# Patient Record
Sex: Male | Born: 1965 | Hispanic: Yes | Marital: Married | Smoking: Never smoker
Health system: Southern US, Community
[De-identification: ages and names within clinical notes are randomized; demographics above are authoritative.]

## PROBLEM LIST (undated history)

## (undated) DIAGNOSIS — I10 Essential (primary) hypertension: Secondary | ICD-10-CM

---

## 2013-07-18 ENCOUNTER — Emergency Department (HOSPITAL_COMMUNITY): Payer: No Typology Code available for payment source

## 2013-07-18 ENCOUNTER — Emergency Department (HOSPITAL_COMMUNITY)
Admission: EM | Admit: 2013-07-18 | Discharge: 2013-07-18 | Disposition: A | Discharge status: Home / Self Care | Payer: No Typology Code available for payment source | Attending: Emergency Medicine | Admitting: Emergency Medicine

## 2013-07-18 ENCOUNTER — Encounter (HOSPITAL_COMMUNITY): Payer: Self-pay | Admitting: Emergency Medicine

## 2013-07-18 DIAGNOSIS — S199XXA Unspecified injury of neck, initial encounter: Secondary | ICD-10-CM

## 2013-07-18 DIAGNOSIS — M5126 Other intervertebral disc displacement, lumbar region: Secondary | ICD-10-CM | POA: Insufficient documentation

## 2013-07-18 DIAGNOSIS — S59909A Unspecified injury of unspecified elbow, initial encounter: Secondary | ICD-10-CM | POA: Insufficient documentation

## 2013-07-18 DIAGNOSIS — IMO0002 Reserved for concepts with insufficient information to code with codable children: Secondary | ICD-10-CM | POA: Insufficient documentation

## 2013-07-18 DIAGNOSIS — M5136 Other intervertebral disc degeneration, lumbar region: Secondary | ICD-10-CM

## 2013-07-18 DIAGNOSIS — Y9389 Activity, other specified: Secondary | ICD-10-CM | POA: Insufficient documentation

## 2013-07-18 DIAGNOSIS — S060X9A Concussion with loss of consciousness of unspecified duration, initial encounter: Secondary | ICD-10-CM | POA: Insufficient documentation

## 2013-07-18 DIAGNOSIS — Y92410 Unspecified street and highway as the place of occurrence of the external cause: Secondary | ICD-10-CM | POA: Insufficient documentation

## 2013-07-18 DIAGNOSIS — S3981XA Other specified injuries of abdomen, initial encounter: Secondary | ICD-10-CM | POA: Insufficient documentation

## 2013-07-18 DIAGNOSIS — M549 Dorsalgia, unspecified: Secondary | ICD-10-CM

## 2013-07-18 DIAGNOSIS — M542 Cervicalgia: Secondary | ICD-10-CM

## 2013-07-18 DIAGNOSIS — S0993XA Unspecified injury of face, initial encounter: Secondary | ICD-10-CM | POA: Insufficient documentation

## 2013-07-18 DIAGNOSIS — S6990XA Unspecified injury of unspecified wrist, hand and finger(s), initial encounter: Secondary | ICD-10-CM | POA: Insufficient documentation

## 2013-07-18 DIAGNOSIS — S59919A Unspecified injury of unspecified forearm, initial encounter: Secondary | ICD-10-CM

## 2013-07-18 DIAGNOSIS — R109 Unspecified abdominal pain: Secondary | ICD-10-CM | POA: Insufficient documentation

## 2013-07-18 LAB — BASIC METABOLIC PANEL
BUN: 11 mg/dL (ref 6–23)
CO2: 23 mEq/L (ref 19–32)
Calcium: 8.8 mg/dL (ref 8.4–10.5)
Chloride: 105 mEq/L (ref 96–112)
Creatinine, Ser: 0.64 mg/dL (ref 0.50–1.35)
GFR calc Af Amer: >90 mL/min (ref >90)
GFR calc non Af Amer: >90 mL/min (ref >90)
Glucose, Bld: 111 mg/dL — ABNORMAL HIGH (ref 70–99)
Potassium: 4 mEq/L (ref 3.7–5.3)
Sodium: 143 mEq/L (ref 137–147)

## 2013-07-18 MED ORDER — IOHEXOL 300 MG/ML  SOLN
100.0000 mL | Freq: Once | INTRAMUSCULAR | Status: AC | PRN
  Administered 2013-07-18: 100 mL via INTRAVENOUS

## 2013-07-18 MED ORDER — HYDROCODONE-ACETAMINOPHEN 5-325 MG PO TABS: 1.0000 | ORAL_TABLET | ORAL | Status: DC | PRN

## 2013-07-18 MED ORDER — MORPHINE SULFATE 4 MG/ML IJ SOLN
4.0000 mg | Freq: Once | INTRAMUSCULAR | Status: AC
  Administered 2013-07-18: 4 mg via INTRAVENOUS
  Filled 2013-07-18: qty 1

## 2013-07-18 MED ORDER — IBUPROFEN 800 MG PO TABS: 800.0000 mg | ORAL_TABLET | Freq: Three times a day (TID) | ORAL | Status: DC | PRN

## 2013-07-18 MED ORDER — HYDROCODONE-ACETAMINOPHEN 5-325 MG PO TABS: 2.0000 | ORAL_TABLET | Freq: Once | ORAL | Status: DC

## 2013-07-18 NOTE — Discharge Instructions (Signed)
Read the information below.  Use the prescribed medication as directed.  Please discuss all new medications with your pharmacist.  Do not take additional tylenol while taking the prescribed pain medication to avoid overdose.  You may return to the Emergency Department at any time for worsening condition or any new symptoms that concern you.   If you develop fevers, loss of control of bowel or bladder, weakness or numbness in your legs, or are unable to walk, return to the ER for a recheck. If you develop high fevers, worsening abdominal pain, uncontrolled vomiting, or are unable to tolerate fluids by mouth, return to the ER for a recheck.    Lea la informacin a continuacin. Usar el medicamento recetado como se indica. Por favor discutir todos los nuevos medicamentos con su farmacutico. No tome Tylenol adicional mientras est tomando el medicamento para el dolor recetada para evitar sobredosis. Usted puede volver a la sala de Oceanographer en cualquier momento por el empeoramiento de la condicin o nuevos sntomas que le preocupan . Si usted desarrolla fiebre, prdida de control del intestino o la vejiga , debilidad o entumecimiento en las piernas , o no puede caminar , volver a la sala de Database administrator a Ecologist . Si usted desarrolla fiebre alta, aumento del dolor abdominal, vmitos incontrolados , o es incapaz de Web designer lquidos por va oral , volver a la sala de emergencias de volver a examinar .    Colisin con un vehculo de motor Academic librarian) Luego de una colisin, es comn presentar mltiples moretones y Research scientist (life sciences). Estas molestias generalmente empeoran durante las primeras 24 horas. Usted gradualmente se pondr ms rgido y con ms dolor en las horas siguientes. Podr sentirse peor cuando despierte en la maana siguiente al accidente. A partir de all, debera comenzar a Risk manager que pase. La velocidad con que se mejora generalmente depende de la gravedad de la  colisin, la cantidad de lesiones y la ubicacin y Firefighter de las mismas. INSTRUCCIONES PARA EL CUIDADO EN EL HOGAR   Aplique hielo sobre la zona lesionada.  Ponga el hielo en una bolsa plstica.  Colquese una toalla entre la piel y la bolsa de hielo.  Deje el hielo durante 15 a 20 minutos, 3 a 4 veces por da.  Debe ingerir gran cantidad de lquido para mantener la orina de tono claro o color amarillo plido.  No beba alcohol.  Tome una ducha o un bao caliente o bese una o dos veces por da. Esto aumentar el flujo de Computer Sciences Corporation msculos doloridos.  Puede volver a sus ocupaciones cuando se lo indique el mdico. Tenga cuidado al levantar objetos, ya que puede agravar el dolor en el cuello o en la espalda.  Utilice los medicamentos de venta libre o de prescripcin para Chief Technology Officer, Environmental health practitioner o la [place], segn se lo indique el profesional que lo asiste. No tome aspirina. Podran aumentar los hematomas o las hemorragias. SOLICITE ATENCIN MDICA DE INMEDIATO SI SIENTE:  Entumecimiento, hormigueo, debilidad o problemas con el uso de los brazos o las piernas.  Dolor de cabeza intenso que no mejora con medicamentos.  Siente dolor intenso en el cuello, especialmente sensibilidad en el centro de la espalda o el cuello.  Cambios en el control del intestino o la vejiga.  Aumento del dolor en cualquier parte del cuerpo.  Falta de aire, mareos o [place].  Siente dolor en el pecho.  Nuseas, vmitos o sudoracin.  Aumento del malestar abdominal.  Sangre en la orina, en las heces o vmitos con Terltonsangre.  Siente dolor en los hombros (en la zona de los breteles).  Que sus sntomas empeoran. EST SEGURO QUE:   Comprende las instrucciones para el alta mdica.  Controlar su enfermedad.  Solicitar atencin mdica de inmediato segn las indicaciones. Document Released: 01/01/2005 Document Revised: 06/16/2011 [facility] Patient Information 2014 [place]ExitCare, [place]LLC.  Dolor  msculoesqueltico (Musculoskeletal Pain) El dolor musculoesqueltico se siente en huesos y msculos. El dolor puede ocurrir en cualquier parte del cuerpo. El profesional que lo asiste podr tratarlo sin Geologist, engineeringconocer la causa del dolor. Lo tratar Time Warneraunque las pruebas de laboratorio (sangre y Comorosorina), las radiografas y otros estudios sean normales. La causa de estos dolores puede ser un virus.  CAUSAS Generalmente no existe una causa definida para este trastorno. Tambin el Citigroupmalestar puede deberse a la Silverstreetactividad excesiva. En la actividad excesiva se incluye el hacer ejercicios fsicos muy intensos cuando no se est en buena forma. El dolor de huesos tambin puede deberse a cambios climticos. Los huesos son sensibles a los cambios en la presin atmosfrica. INSTRUCCIONES PARA EL CUIDADO DOMICILIARIO  Para proteger su privacidad, no se entregarn los The Sherwin-Williamsresultados de las pruebas por telfono. Asegrese de conseguirlos. Consulte el modo en que podr obtenerlos si no se lo han informado. Es su responsabilidad contar con los Lubrizol Corporationresultados de las pruebas.  Utilice los medicamentos de venta libre o de prescripcin para Chief Technology Officerel dolor, Environmental health practitionerel malestar o la Cheyennefiebre, segn se lo indique el profesional que lo asiste. Si le han administrado medicamentos, no conduzca, no opere maquinarias ni Diplomatic Services operational officerherramientas elctricas, y tampoco firme documentos legales durante 24 horas. No beba alcohol. No tome pldoras para dormir ni otros medicamentos que Museum/gallery curatorpuedan interferir en el tratamiento.  Podr seguir con todas las actividades a menos que stas le ocasionen ms Merck & Codolor. Cuando el dolor disminuya, es importante que gradualmente reanude toda la rutina habitual. Retome las actividades comenzando lentamente. Aumente gradualmente la intensidad y la duracin de sus actividades o del ejercicio.  Durante los perodos de dolor intenso, el reposo en cama puede ser beneficioso. Recustese o sintese en la posicin que le sea ms cmoda.  Coloque hielo sobre la  zona afectada.  Ponga hielo en Lucile Shuttersuna bolsa.  Colquese una toalla entre la piel y la bolsa de hielo.  Aplique el hielo durante 10 a 20 minutos 3  4 veces por da.  Si el dolor empeora, o no desaparece puede ser Northeast Utilitiesnecesario repetir las pruebas o Education officer, environmentalrealizar nuevos exmenes. El profesional que lo asiste podr requerir investigar ms profundamente para Veterinary surgeonencontrar la causa posible. SOLICITE ATENCIN MDICA DE INMEDIATO SI:  Siente que el dolor empeora y no se alivia con los medicamentos.  Siente dolor en el pecho asociado a falta de aire, sudoracin, nuseas o vmitos.  El dolor se localiza en el abdomen.  Comienza a sentir nuevos sntomas que parecen ser diferentes o que lo preocupan. ASEGRESE DE QUE:   Comprende las instrucciones para el alta mdica.  Controlar su enfermedad.  Solicitar atencin mdica de inmediato segn las indicaciones. Document Released: 01/01/2005 Document Revised: 06/16/2011 [facility] Patient Information 2014 CoramExitCare, MarylandLLC.   Emergency Department Resource Guide 1) Find a Doctor and Pay Out of Pocket Although you won't have to find out who is covered by your insurance plan, it is a good idea to ask around and get recommendations. You will then need to call the office and see if the doctor you have chosen will accept you as a new patient  and what types of options they offer for patients who are self-pay. Some doctors offer discounts or will set up payment plans for their patients who do not have insurance, but you will need to ask so you aren't surprised when you get to your appointment.  2) Contact Your Local Health Department Not all health departments have doctors that can see patients for sick visits, but many do, so it is worth a call to see if yours does. If you don't know where your local health department is, you can check in your phone book. The CDC also has a tool to help you locate your state's health department, and many state websites also have listings  of all of their local health departments.  3) Find a Walk-in Clinic If your illness is not likely to be very severe or complicated, you may want to try a walk in clinic. These are popping up all over the country in pharmacies, drugstores, and shopping centers. They're usually staffed by nurse practitioners or physician assistants that have been trained to treat common illnesses and complaints. They're usually fairly quick and inexpensive. However, if you have serious medical issues or chronic medical problems, these are probably not your best option.  No Primary Care Doctor: - Call Health Connect at  [phone] - they can help you locate a primary care doctor that  accepts your insurance, provides certain services, etc. - Physician Referral Service- [phone]  Chronic Pain Problems: Organization         Address  Phone   Notes  [facility]  [phone] Patients need to be referred by their primary care doctor.   Medication Assistance: Organization         Address  Phone   Notes  [facility] [address] [place], [place] [address] [phone]) [phone] --Must be a resident of [facility] -- Must have NO insurance coverage whatsoever (no Medicaid/ Medicare, etc.) -- The pt. MUST have a primary care doctor that directs their care regularly and follows them in the community   MedAssist  [phone]   [employer]  [phone]    Agencies that provide inexpensive medical care: Organization         Address  Phone   Notes  [facility]  [phone]   [facility]    [phone]   [facility] [address] [place], [place] [address] [phone]   [facility] [place] [address], [place] [phone]   [facility]anned Parenthood    [phone]   [facility]    [phone]   [facility]  [address], [place] Phone:  [phone](336) [phone], Fax:  [phone]) [phone] Hours of Operation:  9 am - 6 pm, M-F.  Also accepts Medicaid/Medicare and self-pay.  [facility]  [address], [place] Phone: [phone], Fax: [phone]. Hours of Operation:  8:30 am - 5:30 pm, M-F.  Also accepts Medicaid and self-pay.  [facility] [place] [address], [place] Phone: [phone]) [phone]   [facility] [address], [place], [place] [phone], Ext. [phone] Mondays & Thursdays: 7-9 AM.  First 15 patients are seen on a first come, first serve basis.    Medicaid-accepting [facility] Providers:  Retail buyerrganization         Address  Phone  Notes  [facility] [address], [place] [phone] Also accepts self-pay patients.  [facility] [address] Laurell Josephs [place], [place]  [phone]   [facility] [address], [place] [phone]   [facility] [address], [place] [phone]   Renaye Rakers [address], [place]   [phone] Only accepts [place] Access [place] patients after they have their name applied to their card.   Self-Pay (no insurance) in [facility]:  Organization         Address  Phone   Notes  Sickle Cell Patients, [facility] [address] [place], [place] [phone]   [facility] [address] [place], [place] [phone]   [facility] [place]  [address], [place] [phone]   [facility]/Dr. Osei-Bonsu  [address] [place] or [address], [place] [phone] Phone number for both [place] and [place] locations is the same.  [facility] [address], [place] [phone]   [facility] [address] [place] or [address] [phone]   [facility] [address], [place] [phone], phone; [phone], fax Sees patients 1st and 3rd Saturday of every month.  Must not qualify for public or private insurance (i.e. Medicaid, Medicare, [place] Health Choice, Veterans' Benefits)  Household income should be no more than 200% of the poverty level The clinic cannot treat you if you are pregnant or think you are pregnant  Sexually transmitted diseases are not treated at the clinic.    Dental Care: Organization         Address  Phone  Notes  [facility] [address] [place], [place] [phone] Accepts children up to age 76 who are enrolled in [place] or [place] Health Choice; pregnant women with a Medicaid card; and children who have applied for Medicaid or [place] Health Choice, but were declined, whose parents can pay a reduced fee at time of service.  [facility]  [address], [place] [phone] Accepts children up to age 75 who are enrolled in [place] or Ages Health Choice; pregnant women with a Medicaid card; and children who have applied for Medicaid or  Health Choice, but were declined, whose parents can pay a reduced fee at time of service.  [facility]  [address] [place], [place] [phone] Patients are seen by appointment only. Walk-ins are not accepted. [facility] will see patients 80 years of age and older. Monday - Tuesday (8am-5pm) Most Wednesdays (8:30-5pm) $30 per visit, cash only  [facility]  [address], [facility] [phone] Patients are seen by appointment only. Walk-ins are not accepted. [facility] will see patients 80 years of age and older. One Wednesday Evening (Monthly: Volunteer Based).  $30 per visit, cash only  [employer]   [phone] for adults; Children under age 26, call Graduate Pediatric Dentistry at [phone]. Children aged 2-14, please call [phone] to request a pediatric application.  Dental services are provided in all areas of dental care including fillings, crowns and bridges, complete  and partial dentures, implants, gum treatment, root canals, and extractions. Preventive care is also provided. Treatment is provided to both adults and children. Patients are selected via a lottery and there is often a waiting list.   [facility] [address], [place]  [phone] [email or web address]   [facility] [address] [place], [place] [phone], Ext. [phone] Second and Fourth Thursday of each month, opens at 6:30 AM; Clinic ends at 9 AM.  Patients are seen on a first-come first-served basis, and a limited number are seen during each clinic.   [facility]  [address] Ether Griffins Carvel Huskins [place], [place] [phone]   Eligibility Requirements You must have lived in [place], [place], or [place] counties for at least the last three months.   You cannot be eligible for state or federal sponsored [place], including [employer], [place], or [employer].   You generally cannot be eligible for healthcare insurance through your employer.    How to apply: Eligibility screenings are held every Tuesday and Wednesday afternoon from 1:00 pm until 4:00 pm. You do not need an appointment for the interview!  [facility] [address] [place], [place] [phone]   [facility] Health Department  [phone]   [facility] Health Department  [phone]   [facility] Health Department  [phone]    Behavioral Health Resources in the Community: Intensive Outpatient Programs Organization         Address  Phone  Notes  [facility] Services [address] [place], [place] [phone]   [facility] Outpatient [address] [place], [place] [phone]   [facility] [address] [place], [place]  [phone]   [facility] Mental Health [address]  [place], [place] [phone] or [phone]   Substance Abuse Resources Organization         Address  Phone  Notes  Alcohol and Drug Services  [phone]   [facility]  [phone]   [facility] [place]  [phone]   Floydene Flock  [phone]   Residential & Outpatient Substance Abuse Program  [phone]   Psychological Services Organization         Address  Phone  Notes  [facility] Behavioral Health  [phone]   [facility] Services  [phone]   [facility] Mental Health [address] [place] [phone] or [phone]    Mobile Crisis Teams Organization         Address  Phone  Notes  [facility], Mobile Crisis Care Unit  [phone]   [facility]  [address] [place], [place] [phone]   Doristine Locks [address] [place] [phone]    Self-Help/Support Groups Organization         Address  Phone             Notes  [facility] [place] - variety of support groups  [phone]- I7437963 Call for more information  Narcotics Anonymous (NA), [facility] [address], [employer] [place]  2 meetings at this location   Statistician         Address  Phone  Notes  [facility] [address],    [place]  [phone]   [facility]  [address], [place] [address], [place], [place] [phone]   [facility] [address] [place], [place] [phone] Admissions: 8am-3pm M-F  [facility] [address],    [place], [place] [phone]   [facility] [address] [place], [place], [place] [phone]   [facility] [address],  [place], [place] [phone]     [facility] - Intensive Outpatient [address]urell [address], [place], [place] [phone]   [facility] [address] [place]  [place], [place] [phone] or [phone]   [facility] ([facility]) [address], [place], [place] [phone] Accepts Medicaid  [facility] [address],  [place] [phone] Substance Abuse/Addiction Treatment   [facility] Organization         Address  Phone  Notes  [facility]  [phone]   Angie Fava, PhD [address] Ervin Knack [place], [place]   [phone] or [phone]   [facility] Behavioral   [address] [place], [place] [phone]   [facility] [address], [place], [place] [phone] Insurance/Medicaid/sponsorship through [facility] [address]                                    [place], [place] [phone] Therapy/tele-psych/case  [facility] [address], [place] [phone]    Dr. Lolly Mustache  [phone]   [facility] [place]  United Way [facility] Dept. 1) [address] [place] 2) [address], [place] 3)  [address], [place] [phone]  [phone]   [facility] Child Abuse Hotline (325) 002-7249 or [phone] (After Hours)
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
## 2013-07-18 NOTE — ED Notes (Signed)
Upgraded to acuity 3 per PA.

## 2013-07-21 NOTE — ED Provider Notes (Signed)
Medical screening examination/treatment/procedure(s) were performed by non-physician practitioner and as supervising physician I was immediately available for consultation/collaboration.  Melora Menon, MD 07/21/13 0716 

## 2017-12-01 ENCOUNTER — Encounter (HOSPITAL_COMMUNITY): Payer: Self-pay | Admitting: Emergency Medicine

## 2017-12-01 ENCOUNTER — Emergency Department (HOSPITAL_COMMUNITY): Payer: No Typology Code available for payment source

## 2017-12-01 ENCOUNTER — Emergency Department (HOSPITAL_COMMUNITY)
Admission: EM | Admit: 2017-12-01 | Discharge: 2017-12-01 | Disposition: A | Discharge status: Home / Self Care | Payer: No Typology Code available for payment source | Attending: Emergency Medicine | Admitting: Emergency Medicine

## 2017-12-01 ENCOUNTER — Other Ambulatory Visit: Payer: Self-pay

## 2017-12-01 DIAGNOSIS — Y998 Other external cause status: Secondary | ICD-10-CM | POA: Diagnosis not present

## 2017-12-01 DIAGNOSIS — T148XXA Other injury of unspecified body region, initial encounter: Secondary | ICD-10-CM

## 2017-12-01 DIAGNOSIS — S161XXA Strain of muscle, fascia and tendon at neck level, initial encounter: Secondary | ICD-10-CM | POA: Insufficient documentation

## 2017-12-01 DIAGNOSIS — S46912A Strain of unspecified muscle, fascia and tendon at shoulder and upper arm level, left arm, initial encounter: Secondary | ICD-10-CM | POA: Diagnosis not present

## 2017-12-01 DIAGNOSIS — S29012A Strain of muscle and tendon of back wall of thorax, initial encounter: Secondary | ICD-10-CM | POA: Diagnosis not present

## 2017-12-01 DIAGNOSIS — Y9389 Activity, other specified: Secondary | ICD-10-CM | POA: Insufficient documentation

## 2017-12-01 DIAGNOSIS — Y92410 Unspecified street and highway as the place of occurrence of the external cause: Secondary | ICD-10-CM | POA: Insufficient documentation

## 2017-12-01 DIAGNOSIS — S4992XA Unspecified injury of left shoulder and upper arm, initial encounter: Secondary | ICD-10-CM | POA: Diagnosis present

## 2017-12-01 DIAGNOSIS — M549 Dorsalgia, unspecified: Secondary | ICD-10-CM

## 2017-12-01 MED ORDER — METHOCARBAMOL 500 MG PO TABS: 500.0000 mg | ORAL_TABLET | Freq: Every evening | ORAL | 0 refills | Status: DC | PRN

## 2017-12-01 MED ORDER — NAPROXEN 500 MG PO TABS: 500.0000 mg | ORAL_TABLET | Freq: Two times a day (BID) | ORAL | 0 refills | Status: DC

## 2017-12-01 MED ORDER — ACETAMINOPHEN 500 MG PO TABS
1000.0000 mg | ORAL_TABLET | Freq: Once | ORAL | Status: AC
Start: 2017-12-01 — End: 2017-12-01
  Administered 2017-12-01: 1000 mg via ORAL
  Filled 2017-12-01: qty 2

## 2017-12-01 NOTE — Discharge Instructions (Signed)
Take naproxen 2 times a day with meals.  Do not take other anti-inflammatories at the same time (Advil, Motrin, ibuprofen, Aleve). You may supplement with Tylenol if you need further pain control. Use robaxin as needed for muscle stiffness or soreness.  Have caution, this may make you tired or groggy.  Do not drive or operate heavy machinery while taking this medicine. Use ice packs or heating pads if this helps control your pain. You will likely have continued muscle stiffness and soreness over the next couple days.  Follow-up with urgent care in 1 week if your symptoms are not improving. Return to the emergency room if you develop vision changes, vomiting, slurred speech, numbness, loss of bowel or bladder control, or any new or worsening symptoms.
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
## 2018-06-01 ENCOUNTER — Emergency Department (HOSPITAL_COMMUNITY)
Admission: EM | Admit: 2018-06-01 | Discharge: 2018-06-01 | Disposition: A | Discharge status: Home / Self Care | Payer: Self-pay | Attending: Emergency Medicine | Admitting: Emergency Medicine

## 2018-06-01 ENCOUNTER — Encounter (HOSPITAL_COMMUNITY): Payer: Self-pay

## 2018-06-01 ENCOUNTER — Emergency Department (HOSPITAL_COMMUNITY): Payer: Self-pay

## 2018-06-01 DIAGNOSIS — G510 Bell's palsy: Secondary | ICD-10-CM | POA: Insufficient documentation

## 2018-06-01 DIAGNOSIS — Z79899 Other long term (current) drug therapy: Secondary | ICD-10-CM | POA: Insufficient documentation

## 2018-06-01 DIAGNOSIS — I10 Essential (primary) hypertension: Secondary | ICD-10-CM | POA: Insufficient documentation

## 2018-06-01 DIAGNOSIS — R079 Chest pain, unspecified: Secondary | ICD-10-CM | POA: Insufficient documentation

## 2018-06-01 LAB — COMPREHENSIVE METABOLIC PANEL
ALT: 23 U/L (ref 0–44)
AST: 20 U/L (ref 15–41)
Albumin: 3.7 g/dL (ref 3.5–5.0)
Alkaline Phosphatase: 108 U/L (ref 38–126)
Anion gap: 8 (ref 5–15)
BUN: 9 mg/dL (ref 6–20)
CO2: 24 mmol/L (ref 22–32)
Calcium: 8.9 mg/dL (ref 8.9–10.3)
Chloride: 106 mmol/L (ref 98–111)
Creatinine, Ser: 0.75 mg/dL (ref 0.61–1.24)
GFR calc Af Amer: >60 mL/min (ref >60)
GFR calc non Af Amer: >60 mL/min (ref >60)
Glucose, Bld: 124 mg/dL — ABNORMAL HIGH (ref 70–99)
Potassium: 3.7 mmol/L (ref 3.5–5.1)
Sodium: 138 mmol/L (ref 135–145)
Total Bilirubin: 0.6 mg/dL (ref 0.3–1.2)
Total Protein: 7.1 g/dL (ref 6.5–8.1)

## 2018-06-01 LAB — DIFFERENTIAL
Abs Immature Granulocytes: 0.02 10*3/uL (ref 0.00–0.07)
Basophils Absolute: 0 10*3/uL (ref 0.0–0.1)
Basophils Relative: 0 %
Eosinophils Absolute: 0.1 10*3/uL (ref 0.0–0.5)
Eosinophils Relative: 1 %
Immature Granulocytes: 0 %
Lymphocytes Relative: 34 %
Lymphs Abs: 2.3 10*3/uL (ref 0.7–4.0)
Monocytes Absolute: 0.4 10*3/uL (ref 0.1–1.0)
Monocytes Relative: 6 %
NEUTROS ABS: 4.1 10*3/uL (ref 1.7–7.7)
NEUTROS PCT: 59 %

## 2018-06-01 LAB — CBC
HCT: 44.7 % (ref 39.0–52.0)
HEMOGLOBIN: 15.1 g/dL (ref 13.0–17.0)
MCH: 30.1 pg (ref 26.0–34.0)
MCHC: 33.8 g/dL (ref 30.0–36.0)
MCV: 89 fL (ref 80.0–100.0)
Platelets: 271 10*3/uL (ref 150–400)
RBC: 5.02 MIL/uL (ref 4.22–5.81)
RDW: 13.1 % (ref 11.5–15.5)
WBC: 7 10*3/uL (ref 4.0–10.5)
nRBC: 0 % (ref 0.0–0.2)

## 2018-06-01 LAB — CBG MONITORING, ED: Glucose-Capillary: 89 mg/dL (ref 70–99)

## 2018-06-01 LAB — PROTIME-INR
INR: 1.1 (ref 0.8–1.2)
Prothrombin Time: 13.8 seconds (ref 11.4–15.2)

## 2018-06-01 LAB — APTT: APTT: 32 s (ref 24–36)

## 2018-06-01 MED ORDER — HYPROMELLOSE (GONIOSCOPIC) 2.5 % OP SOLN: 1.0000 [drp] | Freq: Four times a day (QID) | OPHTHALMIC | 1 refills | Status: DC | PRN

## 2018-06-01 MED ORDER — ACYCLOVIR 400 MG PO TABS: 400.0000 mg | ORAL_TABLET | Freq: Every day | ORAL | 0 refills | Status: DC

## 2018-06-01 MED ORDER — SODIUM CHLORIDE 0.9% FLUSH
3.0000 mL | Freq: Once | INTRAVENOUS | Status: DC
Start: 2018-06-01 — End: 2018-06-01

## 2018-06-01 MED ORDER — PREDNISONE 20 MG PO TABS: 60.0000 mg | ORAL_TABLET | Freq: Every day | ORAL | 0 refills | Status: DC

## 2018-06-01 NOTE — ED Notes (Signed)
Patient went to radiology then to room.

## 2018-06-01 NOTE — Discharge Instructions (Signed)
We saw in the ER for right-sided facial droop and headaches.  CT scan of your brain does not show any surrounding findings. We suspect that you are having Bell's palsy.  Please read the instructions provided on this diagnosis.  Recommend that you follow-up with a neurologist if your symptoms are not getting better and return to the ER if your symptoms are getting worse.  Also we noted that your blood pressure was high.  Please follow-up with the [facility] doctors for optimal management of your blood pressure.

## 2018-06-01 NOTE — ED Notes (Signed)
Got patient undress on the monitor patient is resting with call bell in reach and family at bedside 

## 2018-06-01 NOTE — ED Notes (Signed)
Pt back from CT

## 2018-06-01 NOTE — ED Triage Notes (Signed)
Pt presents for evaluation of dizziness, headache, R sided facial droop and pain and chest pain x 1 week. No hx of same.

## 2018-06-03 NOTE — ED Provider Notes (Signed)
[facility] EMERGENCY DEPARTMENT Provider Note   CSN: [id number] Arrival date & time: 06/01/18  1249    History   Chief Complaint Chief Complaint  Patient presents with  . Dizziness  . Facial Droop  . Chest Pain    HPI Todd Atkinson is a 53 y.o. male.     HPI  Translation services were utilized.  53 year old male comes into the ER with chief complaint of facial droop.  Patient states that he started having facial droop about 2 weeks ago.  Patient decided initially not to come to the ER, but over the past few days he started noticing that he is having drooling and itching and tearing from his right eye.  Patient denies any focal numbness, tingling, weakness, vision changes.  He denies any history of stroke.  He also denies any recent illnesses.  Patient does indicate that he has had some episodes of headache and chest pain.  At the moment he does not have either.  Patient denies any heavy smoking but indicates that he used to be heavy drinker.  For the last 2 or 3 years he has cut down his drinking to weekends only.  History reviewed. No pertinent past medical history.  There are no active problems to display for this patient.   History reviewed. No pertinent surgical history.      Home Medications    Prior to Admission medications   Medication Sig Start Date End Date Taking? Authorizing Provider  acyclovir (ZOVIRAX) 400 MG tablet Take 1 tablet (400 mg total) by mouth 5 (five) times daily. 06/01/18   Derwood Kaplan, MD  HYDROcodone-acetaminophen (NORCO/VICODIN) 5-325 MG per tablet Take 1 tablet by mouth every 4 (four) hours as needed for moderate pain or severe pain. 07/18/13   Trixie Dredge, PA-C  hydroxypropyl methylcellulose / hypromellose (ISOPTO TEARS / GONIOVISC) 2.5 % ophthalmic solution Place 1 drop into the right eye 4 (four) times daily as needed for dry eyes. 06/01/18   Derwood Kaplan, MD  ibuprofen (ADVIL,MOTRIN) 800 MG tablet Take 1  tablet (800 mg total) by mouth every 8 (eight) hours as needed for mild pain or moderate pain. 07/18/13   Trixie Dredge, PA-C  methocarbamol (ROBAXIN) 500 MG tablet Take 1 tablet (500 mg total) by mouth at bedtime as needed for muscle spasms. 12/01/17   Caccavale, Sophia, PA-C  naproxen (NAPROSYN) 500 MG tablet Take 1 tablet (500 mg total) by mouth 2 (two) times daily with a meal. 12/01/17   Caccavale, Sophia, PA-C  predniSONE (DELTASONE) 20 MG tablet Take 3 tablets (60 mg total) by mouth daily. 06/01/18   Derwood Kaplan, MD    Family History No family history on file.  Social History Social History   Tobacco Use  . Smoking status: Never Smoker  Substance Use Topics  . Alcohol use: Yes  . Drug use: No     Allergies   Patient has no known allergies.   Review of Systems Review of Systems  Constitutional: Positive for activity change.  HENT: Positive for drooling. Negative for congestion, dental problem and trouble swallowing.   Eyes: Positive for discharge and itching. Negative for photophobia and visual disturbance.  Musculoskeletal: Negative for neck pain and neck stiffness.  Neurological: Positive for facial asymmetry and headaches. Negative for dizziness, speech difficulty, weakness, light-headedness and numbness.  All other systems reviewed and are negative.    Physical Exam Updated Vital Signs BP (!) 169/83   Pulse 74   Temp 97.8 F (36.6  C) (Oral)   Resp 15   SpO2 97%   Physical Exam Vitals signs and nursing note reviewed.  Constitutional:      Appearance: He is well-developed.  HENT:     Head: Atraumatic.  Neck:     Musculoskeletal: Neck supple.  Cardiovascular:     Rate and Rhythm: Normal rate.  Pulmonary:     Effort: Pulmonary effort is normal.  Skin:    General: Skin is warm.  Neurological:     Mental Status: He is alert and oriented to person, place, and time.     Comments: Patient is noted to have right-sided facial droop which is very prominent when  he tries to smile.  Patient is able to close his eyes but it evident that the muscles of the eyelid are weaker on the right side.  Otherwise there is no focal weakness or numbness of the upper or lower extremities, gaze normalities, dysmetria.      ED Treatments / Results  Labs (all labs ordered are listed, but only abnormal results are displayed) Labs Reviewed  COMPREHENSIVE METABOLIC PANEL - Abnormal; Notable for the following components:      Result Value   Glucose, Bld 124 (*)    All other components within normal limits  PROTIME-INR  APTT  CBC  DIFFERENTIAL  CBG MONITORING, ED    EKG EKG Interpretation  Date/Time:  Tuesday June 01 2018 13:32:09 EST Ventricular Rate:  86 PR Interval:  144 QRS Duration: 88 QT Interval:  374 QTC Calculation: 447 R Axis:   124 Text Interpretation:  Normal sinus rhythm Left posterior fascicular block Possible Inferior infarct , age undetermined Cannot rule out Anterior infarct , age undetermined Abnormal ECG No acute changes No significant change since last tracing Confirmed by Derwood KaplanNanavati, Klay Sobotka [phone]([id number]) on 06/01/2018 3:12:43 PM   Radiology No results found.  Procedures Procedures (including critical care time)  Medications Ordered in ED Medications - No data to display   Initial Impression / Assessment and Plan / ED Course  I have reviewed the triage vital signs and the nursing notes.  Pertinent labs & imaging results that were available during my care of the patient were reviewed by me and considered in my medical decision making (see chart for details).        53 year old male with no significant medical history comes in with chief complaint of facial droop.  He had a CT scan of the head done from the triage, which is negative for any acute findings.  Clinically it appears that he has Bell's palsy.  Patient symptoms have been going on for almost 2 weeks now, but it seems like they have worsened over the past 3 days.  We will  start him on appropriate medications and have him follow-up with outpatient services.  Final Clinical Impressions(s) / ED Diagnoses   Final diagnoses:  Bell's palsy  Hypertension, unspecified type    ED Discharge Orders         Ordered    hydroxypropyl methylcellulose / hypromellose (ISOPTO TEARS / GONIOVISC) 2.5 % ophthalmic solution  4 times daily PRN,   Status:  Discontinued     06/01/18 1601    predniSONE (DELTASONE) 20 MG tablet  Daily,   Status:  Discontinued     06/01/18 1601    acyclovir (ZOVIRAX) 400 MG tablet  5 times daily,   Status:  Discontinued     06/01/18 1601    acyclovir (ZOVIRAX) 400 MG tablet  5 times daily  06/01/18 1602    predniSONE (DELTASONE) 20 MG tablet  Daily     06/01/18 1602    hydroxypropyl methylcellulose / hypromellose (ISOPTO TEARS / GONIOVISC) 2.5 % ophthalmic solution  4 times daily PRN     06/01/18 1602           Derwood Kaplan, MD 06/03/18 2351

## 2018-09-15 ENCOUNTER — Encounter (HOSPITAL_COMMUNITY): Payer: Self-pay | Admitting: Emergency Medicine

## 2018-09-15 ENCOUNTER — Other Ambulatory Visit: Payer: Self-pay

## 2018-09-15 ENCOUNTER — Emergency Department (HOSPITAL_COMMUNITY)
Admission: EM | Admit: 2018-09-15 | Discharge: 2018-09-15 | Disposition: A | Discharge status: Home / Self Care | Payer: Self-pay | Attending: Emergency Medicine | Admitting: Emergency Medicine

## 2018-09-15 DIAGNOSIS — J020 Streptococcal pharyngitis: Secondary | ICD-10-CM | POA: Insufficient documentation

## 2018-09-15 DIAGNOSIS — Z79899 Other long term (current) drug therapy: Secondary | ICD-10-CM | POA: Insufficient documentation

## 2018-09-15 MED ORDER — PENICILLIN G BENZATHINE 1200000 UNIT/2ML IM SUSP
1.2000 10*6.[IU] | Freq: Once | INTRAMUSCULAR | Status: AC
  Administered 2018-09-15: 1.2 10*6.[IU] via INTRAMUSCULAR
  Filled 2018-09-15: qty 2

## 2018-09-15 NOTE — ED Triage Notes (Signed)
Pt in with R face numbness and HA x 3 days. States fever since last night, temp now 99.4. Denies any weakness of extremities

## 2018-09-15 NOTE — ED Triage Notes (Signed)
Pt denies facial numbness, sts the right side of face and his throat hurts since Sunday. Endorses fever since Monday. Denies cough.

## 2018-09-15 NOTE — Discharge Instructions (Addendum)
Use Motrin or Tylenol for pain and fever.  Drink plenty of fluids.  See the doctor of your choice for problems.

## 2018-09-15 NOTE — ED Provider Notes (Signed)
[place] EMERGENCY DEPARTMENT Provider Note   CSN: [id number] Arrival date & time: 09/15/18  1106    History   Chief Complaint Chief Complaint  Patient presents with  . Headache  . facial numbness    HPI Todd Atkinson is a 53 y.o. male.  Interview using [employer] language interpreter telemetry.     HPI   He presents for evaluation of right-sided facial numbness and headache for 3 days.  He also had a fever, last night.  He states his neck hurts in a few right jaw, and he has a right-sided sore throat.  Using penicillin which he had leftover, and has taken 3 doses.  He denies cough, dizziness, nausea, vomiting, known sick exposure, or recent travel outside the country.  He states that his weakness from the facial nerve palsy he had in February 2020, has resolved.  There are no other known modifying factors.  History reviewed. No pertinent past medical history.  There are no active problems to display for this patient.   History reviewed. No pertinent surgical history.      Home Medications    Prior to Admission medications   Medication Sig Start Date End Date Taking? Authorizing Provider  acyclovir (ZOVIRAX) 400 MG tablet Take 1 tablet (400 mg total) by mouth 5 (five) times daily. 06/01/18   Varney Biles, MD  HYDROcodone-acetaminophen (NORCO/VICODIN) 5-325 MG per tablet Take 1 tablet by mouth every 4 (four) hours as needed for moderate pain or severe pain. 07/18/13   Clayton Bibles, PA-C  hydroxypropyl methylcellulose / hypromellose (ISOPTO TEARS / GONIOVISC) 2.5 % ophthalmic solution Place 1 drop into the right eye 4 (four) times daily as needed for dry eyes. 06/01/18   Varney Biles, MD  ibuprofen (ADVIL,MOTRIN) 800 MG tablet Take 1 tablet (800 mg total) by mouth every 8 (eight) hours as needed for mild pain or moderate pain. 07/18/13   Clayton Bibles, PA-C  methocarbamol (ROBAXIN) 500 MG tablet Take 1 tablet (500 mg total) by mouth at bedtime as  needed for muscle spasms. 12/01/17   Caccavale, Sophia, PA-C  naproxen (NAPROSYN) 500 MG tablet Take 1 tablet (500 mg total) by mouth 2 (two) times daily with a meal. 12/01/17   Caccavale, Sophia, PA-C  predniSONE (DELTASONE) 20 MG tablet Take 3 tablets (60 mg total) by mouth daily. 06/01/18   Varney Biles, MD    Family History No family history on file.  Social History Social History   Tobacco Use  . Smoking status: Never Smoker  . Smokeless tobacco: Never Used  Substance Use Topics  . Alcohol use: Yes  . Drug use: No     Allergies   Patient has no known allergies.   Review of Systems Review of Systems  All other systems reviewed and are negative.    Physical Exam Updated Vital Signs BP (!) 159/86   Pulse 100   Temp 98.8 F (37.1 C) (Oral)   Resp (!) 22   Ht 5' 6.14" (1.68 m)   Wt 90.7 kg   SpO2 97%   BMI 32.14 kg/m   Physical Exam Vitals signs and nursing note reviewed.  Constitutional:      General: He is not in acute distress.    Appearance: He is well-developed and normal weight. He is not ill-appearing, toxic-appearing or diaphoretic.  HENT:     Head: Normocephalic and atraumatic.     Right Ear: External ear normal.     Left Ear: External ear normal.  Mouth/Throat:     Comments: Bilateral tonsillar hypertrophy right greater than left.  Mild erythema and exudate of both tonsils.  No peritonsillar swelling, or significant deviation of the uvula.  No trismus. Eyes:     Conjunctiva/sclera: Conjunctivae normal.     Pupils: Pupils are equal, round, and reactive to light.  Neck:     Musculoskeletal: Normal range of motion and neck supple.     Trachea: Phonation normal.  Cardiovascular:     Rate and Rhythm: Normal rate and regular rhythm.     Heart sounds: Normal heart sounds.  Pulmonary:     Effort: Pulmonary effort is normal.     Breath sounds: Normal breath sounds.  Abdominal:     Palpations: Abdomen is soft.     Tenderness: There is no  abdominal tenderness.  Musculoskeletal: Normal range of motion.  Skin:    General: Skin is warm and dry.  Neurological:     Mental Status: He is alert and oriented to person, place, and time.     Cranial Nerves: No cranial nerve deficit.     Sensory: No sensory deficit.     Motor: No abnormal muscle tone.     Coordination: Coordination normal.  Psychiatric:        Mood and Affect: Mood normal.        Behavior: Behavior normal.        Thought Content: Thought content normal.        Judgment: Judgment normal.      ED Treatments / Results  Labs (all labs ordered are listed, but only abnormal results are displayed) Labs Reviewed - No data to display  EKG None  Radiology No results found.  Procedures Procedures (including critical care time)  Medications Ordered in ED Medications  penicillin g benzathine (BICILLIN LA) 1200000 UNIT/2ML injection 1.2 Million Units (1.2 Million Units Intramuscular Given 09/15/18 1213)     Initial Impression / Assessment and Plan / ED Course  I have reviewed the triage vital signs and the nursing notes.  Pertinent labs & imaging results that were available during my care of the patient were reviewed by me and considered in my medical decision making (see chart for details).         Patient Vitals for the past 24 hrs:  BP Temp Temp src Pulse Resp SpO2 Height Weight  09/15/18 1200 (!) 159/86 - - 100 (!) 22 97 % - -  09/15/18 1145 (!) 154/84 - - 100 15 97 % - -  09/15/18 1137 - - - - - - 5' 6.14" (1.68 m) 90.7 kg  09/15/18 1130 (!) 149/73 - - 94 12 93 % - -  09/15/18 1120 (!) 156/81 98.8 F (37.1 C) Oral - 14 96 % - -  09/15/18 1118 (!) 156/81 - - 94 11 97 % - -    12:38 PM Reevaluation with update and discussion. After initial assessment and treatment, an updated evaluation reveals no change in clinical status, findings discussed with the patient and all questions were answered. Mancel BaleElliott Janiaya Ryser   Medical Decision Making: Evaluation  consistent with pharyngitis, likely streptococcal.  No evidence for peritonsillar abscess, systemic infection or metabolic instability.  CRITICAL CARE-no Performed by: Mancel BaleElliott Artavis Cowie  Nursing Notes Reviewed/ Care Coordinated Applicable Imaging Reviewed Interpretation of Laboratory Data incorporated into ED treatment  The patient appears reasonably screened and/or stabilized for discharge and I doubt any other medical condition or other [facility] requiring further screening, evaluation, or treatment in the ED  at this time prior to discharge.  Plan: Home Medications-OTC analgesia of choice, stop using leftover penicillin; Home Treatments-symptomatic treatments; return here if the recommended treatment, does not improve the symptoms; Recommended follow up-PCP, PRN   Final Clinical Impressions(s) / ED Diagnoses   Final diagnoses:  Pharyngitis due to Streptococcus species    ED Discharge Orders    None       Mancel BaleWentz, Lavaughn Haberle, MD 09/15/18 1238

## 2020-02-23 IMAGING — CT CT HEAD W/O CM
4 series · 15 of 47 positions shown, 17 images · non-contrast
Comparison: None.

CLINICAL DATA: Dizziness, headache, right-sided facial droop

EXAM:
CT HEAD WITHOUT CONTRAST
TECHNIQUE: Contiguous axial images were obtained from the base of the skull
through the vertex without intravenous contrast.

[Series 3: head without · axial · non-contrast · 0.48mm/px · z∈[+1033,+1163]mm · 7 of 36 slices shown, 9 images]
[im 5/36  brain]
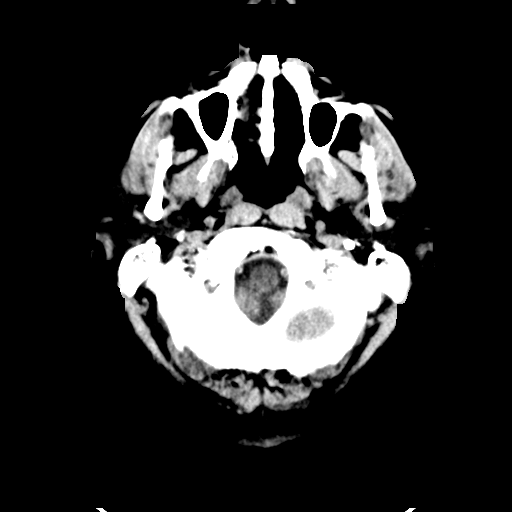
[im 5/36  bone]
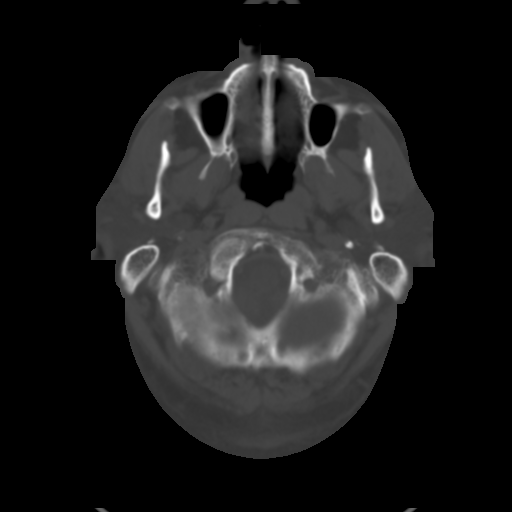
[im 9/36  brain]
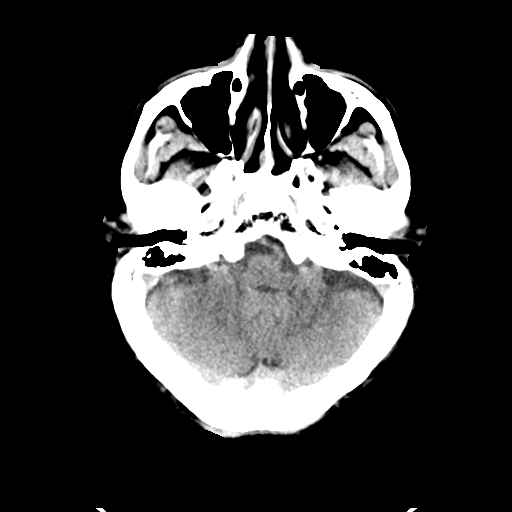
[im 14/36  brain]
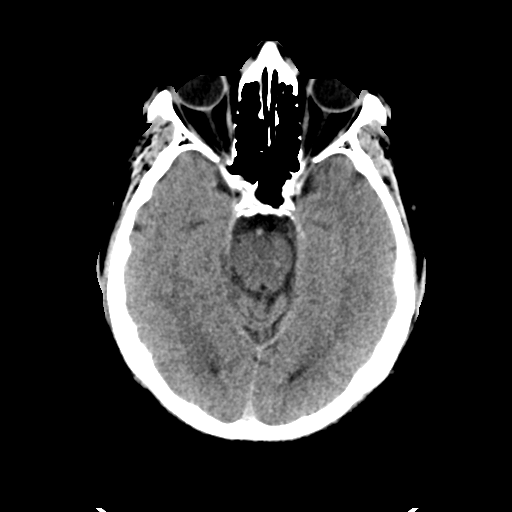
[im 18/36  brain]
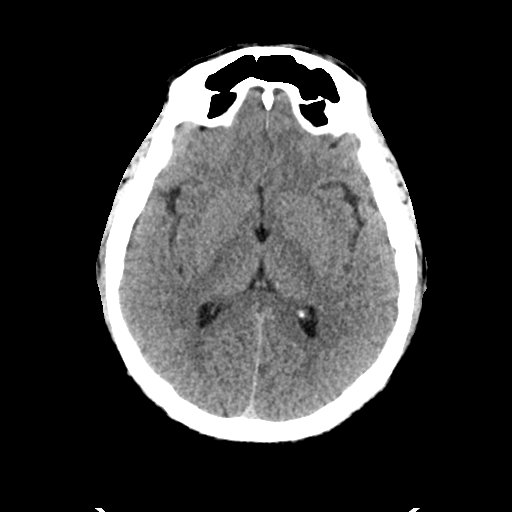
[im 22/36  brain]
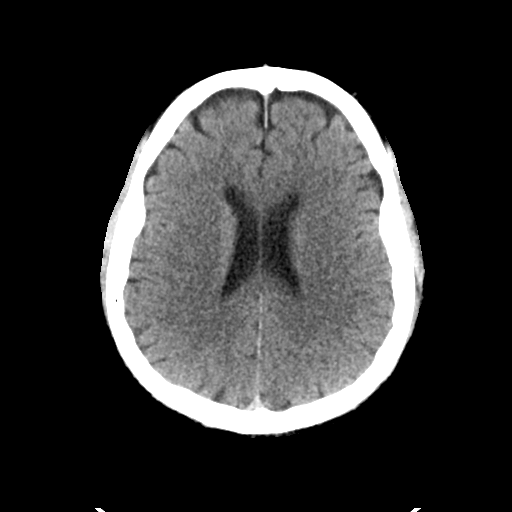
[im 22/36  bone]
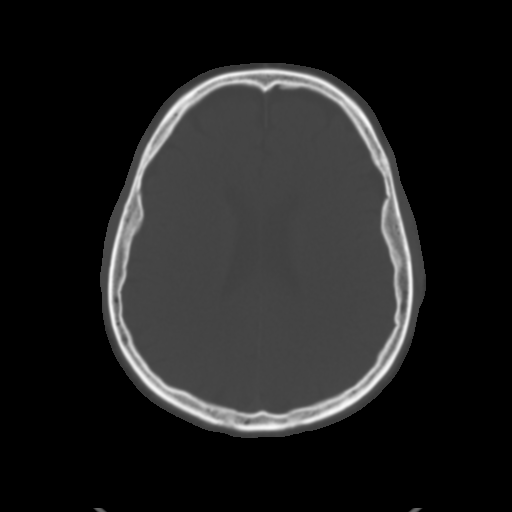
[im 27/36  brain]
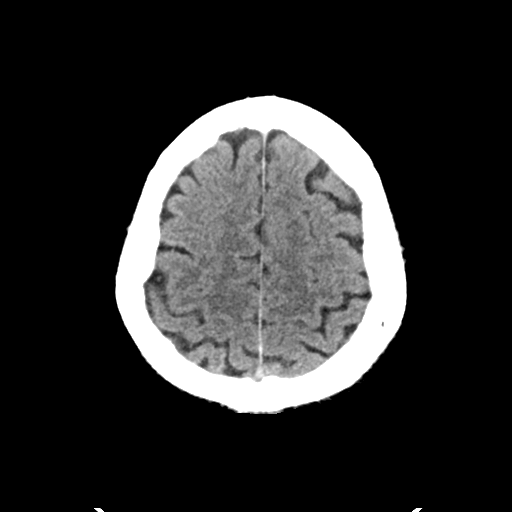
[im 31/36  brain]
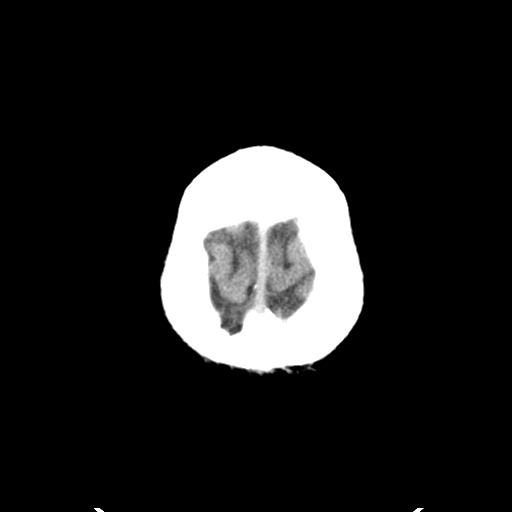

[Series 4: head bone · axial · 0.48mm/px · z∈[+1029,+1047]mm · 2 of 90 slices shown]
[im 9/90  bone]
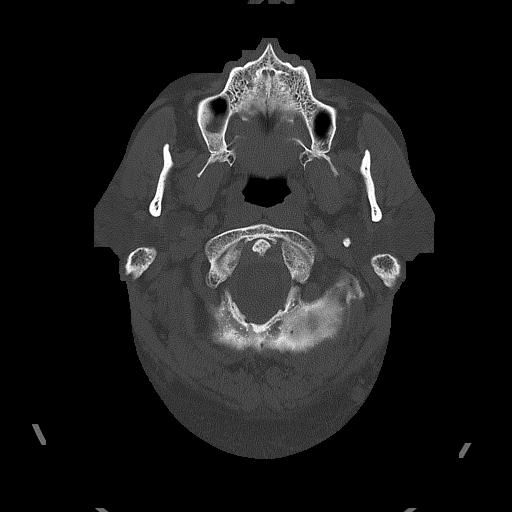
[im 18/90  bone]
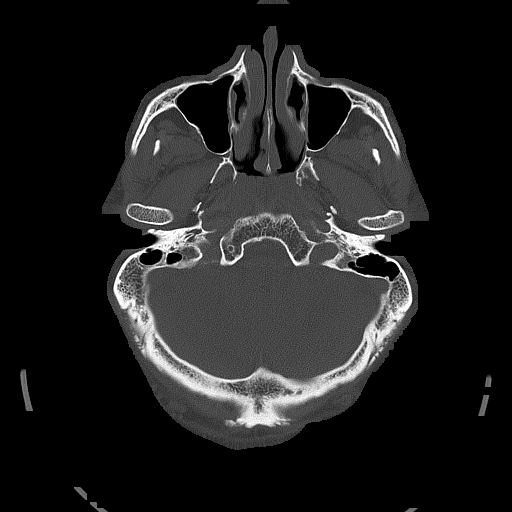

[Series 6: head without sag · sagittal · non-contrast · 0.35mm/px · 3 of 82 slices shown]
[im 28/82  brain]
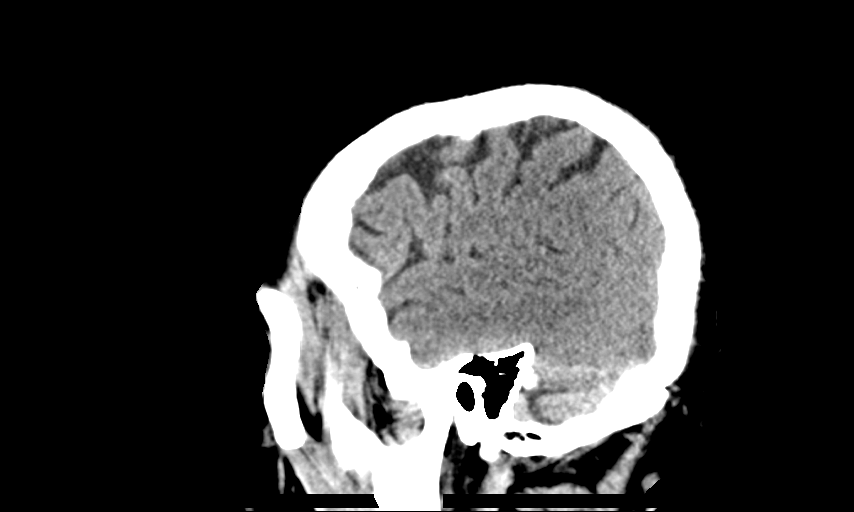
[im 41/82  brain]
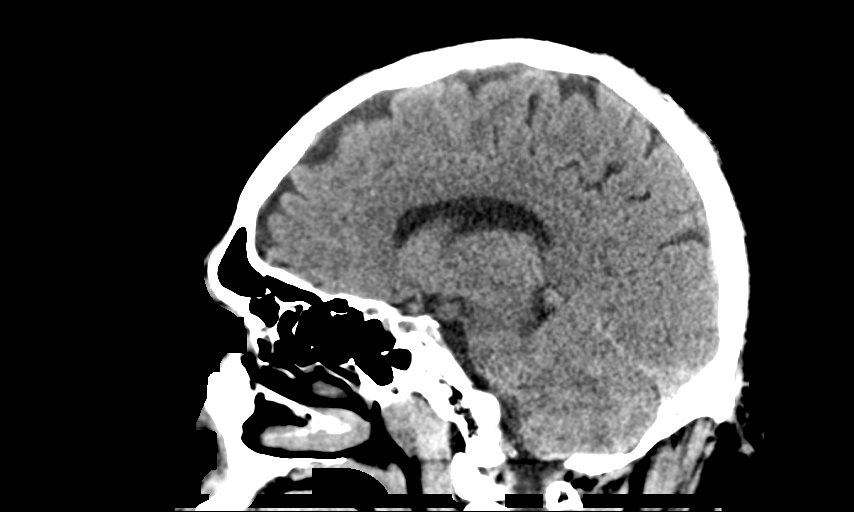
[im 55/82  brain]
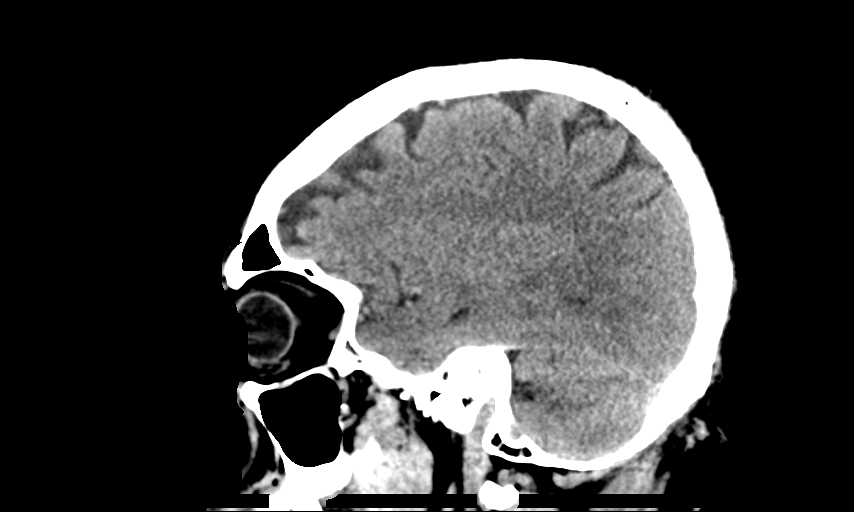

[Series 7: head without cor · coronal · non-contrast · 0.42mm/px · 3 of 82 slices shown]
[im 28/82  brain]
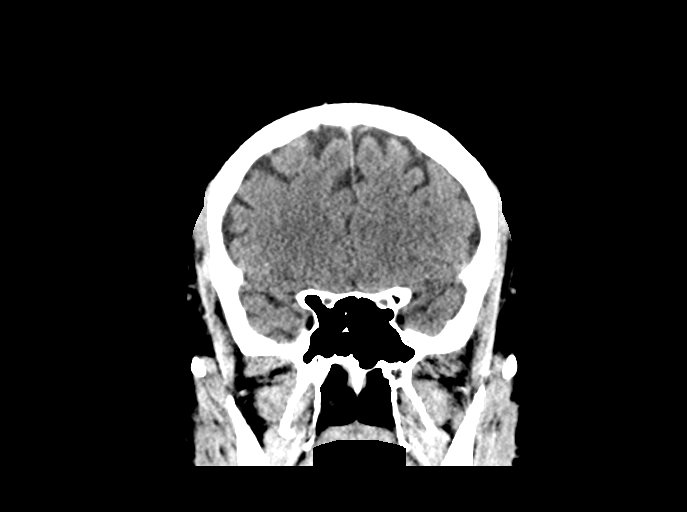
[im 37/82  brain]
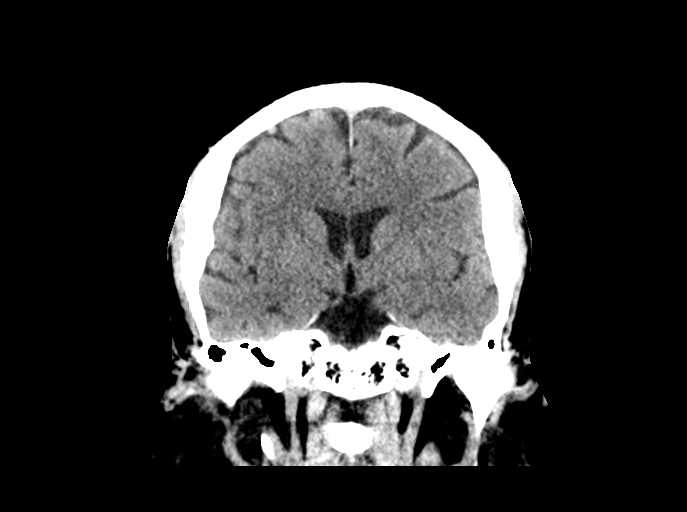
[im 46/82  brain]
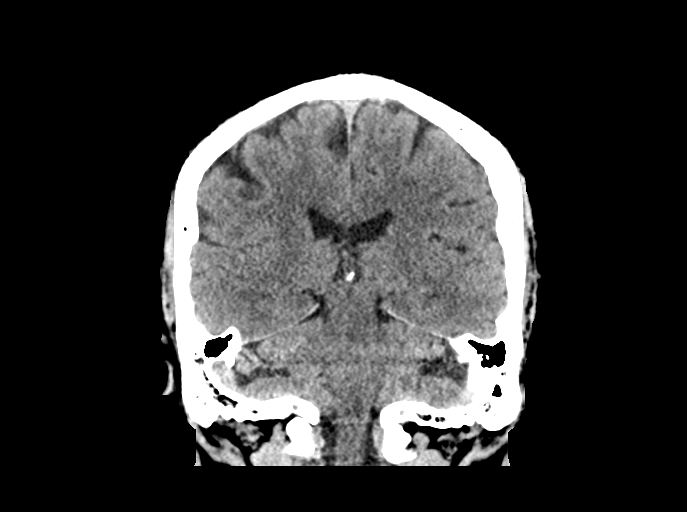

[15 of 47 positions shown; findings below may reference images not displayed]

FINDINGS: Brain: No evidence of acute infarction, hemorrhage, hydrocephalus,
extra-axial collection or mass lesion/mass effect. Mild
periventricular white matter hypodensity.

Vascular: No hyperdense vessel or unexpected calcification.

Skull: Normal. Negative for fracture or focal lesion.

Sinuses/Orbits: No acute finding.

Other: None.
IMPRESSION: No acute intracranial pathology. No non-contrast CT findings to
explain headache or right-sided facial droop. No non-contrast CT
evidence of acute stroke or hemorrhage. Mild small-vessel white
matter disease.

## 2020-05-17 ENCOUNTER — Other Ambulatory Visit: Payer: Self-pay

## 2020-05-17 ENCOUNTER — Encounter (HOSPITAL_COMMUNITY): Payer: Self-pay

## 2020-05-17 ENCOUNTER — Emergency Department (HOSPITAL_COMMUNITY)
Admission: EM | Admit: 2020-05-17 | Discharge: 2020-05-17 | Disposition: A | Discharge status: Home / Self Care | Payer: Self-pay | Attending: Emergency Medicine | Admitting: Emergency Medicine

## 2020-05-17 ENCOUNTER — Emergency Department (HOSPITAL_COMMUNITY): Payer: Self-pay

## 2020-05-17 DIAGNOSIS — Z79899 Other long term (current) drug therapy: Secondary | ICD-10-CM | POA: Insufficient documentation

## 2020-05-17 DIAGNOSIS — M542 Cervicalgia: Secondary | ICD-10-CM | POA: Insufficient documentation

## 2020-05-17 DIAGNOSIS — R0789 Other chest pain: Secondary | ICD-10-CM | POA: Insufficient documentation

## 2020-05-17 DIAGNOSIS — R519 Headache, unspecified: Secondary | ICD-10-CM | POA: Insufficient documentation

## 2020-05-17 DIAGNOSIS — Z7982 Long term (current) use of aspirin: Secondary | ICD-10-CM | POA: Insufficient documentation

## 2020-05-17 DIAGNOSIS — I10 Essential (primary) hypertension: Secondary | ICD-10-CM | POA: Insufficient documentation

## 2020-05-17 DIAGNOSIS — I160 Hypertensive urgency: Secondary | ICD-10-CM | POA: Insufficient documentation

## 2020-05-17 DIAGNOSIS — E876 Hypokalemia: Secondary | ICD-10-CM | POA: Insufficient documentation

## 2020-05-17 HISTORY — DX: Essential (primary) hypertension: I10

## 2020-05-17 LAB — BASIC METABOLIC PANEL
Anion gap: 11 (ref 5–15)
BUN: 13 mg/dL (ref 6–20)
CO2: 24 mmol/L (ref 22–32)
Calcium: 8.6 mg/dL — ABNORMAL LOW (ref 8.9–10.3)
Chloride: 102 mmol/L (ref 98–111)
Creatinine, Ser: 0.7 mg/dL (ref 0.61–1.24)
GFR, Estimated: >60 mL/min (ref >60)
Glucose, Bld: 181 mg/dL — ABNORMAL HIGH (ref 70–99)
Potassium: 3.2 mmol/L — ABNORMAL LOW (ref 3.5–5.1)
Sodium: 137 mmol/L (ref 135–145)

## 2020-05-17 LAB — CBC
HCT: 42.5 % (ref 39.0–52.0)
Hemoglobin: 14.5 g/dL (ref 13.0–17.0)
MCH: 30.3 pg (ref 26.0–34.0)
MCHC: 34.1 g/dL (ref 30.0–36.0)
MCV: 88.9 fL (ref 80.0–100.0)
Platelets: 249 10*3/uL (ref 150–400)
RBC: 4.78 MIL/uL (ref 4.22–5.81)
RDW: 13 % (ref 11.5–15.5)
WBC: 7.4 10*3/uL (ref 4.0–10.5)
nRBC: 0 % (ref 0.0–0.2)

## 2020-05-17 LAB — TROPONIN I (HIGH SENSITIVITY)
Troponin I (High Sensitivity): 3 ng/L (ref <18)
Troponin I (High Sensitivity): 4 ng/L (ref <18)

## 2020-05-17 MED ORDER — HYDROCHLOROTHIAZIDE 25 MG PO TABS: 25.0000 mg | ORAL_TABLET | Freq: Every day | ORAL | 1 refills | Status: DC

## 2020-05-17 MED ORDER — METOCLOPRAMIDE HCL 5 MG/ML IJ SOLN
10.0000 mg | Freq: Once | INTRAMUSCULAR | Status: AC
  Administered 2020-05-17: 10 mg via INTRAVENOUS
  Filled 2020-05-17: qty 2

## 2020-05-17 MED ORDER — KETOROLAC TROMETHAMINE 15 MG/ML IJ SOLN
15.0000 mg | Freq: Once | INTRAMUSCULAR | Status: AC
  Administered 2020-05-17: 15 mg via INTRAVENOUS
  Filled 2020-05-17: qty 1

## 2020-05-17 MED ORDER — POTASSIUM CHLORIDE CRYS ER 20 MEQ PO TBCR
40.0000 meq | EXTENDED_RELEASE_TABLET | Freq: Once | ORAL | Status: AC
  Administered 2020-05-17: 40 meq via ORAL
  Filled 2020-05-17: qty 2

## 2020-05-17 MED ORDER — HYDROCHLOROTHIAZIDE 12.5 MG PO CAPS
25.0000 mg | ORAL_CAPSULE | Freq: Once | ORAL | Status: AC
  Administered 2020-05-17: 25 mg via ORAL
  Filled 2020-05-17: qty 2

## 2020-05-17 MED ORDER — DIPHENHYDRAMINE HCL 50 MG/ML IJ SOLN
25.0000 mg | Freq: Once | INTRAMUSCULAR | Status: AC
  Administered 2020-05-17: 25 mg via INTRAVENOUS
  Filled 2020-05-17: qty 1

## 2020-05-17 NOTE — ED Triage Notes (Signed)
Patient arrived with complaints of left sided chest pain that started tonight, reports at home his systolic blood pressure was 205. Reporting lightheaded and some nausea. States he only takes aspirin for his blood pressure.

## 2020-05-17 NOTE — ED Provider Notes (Signed)
[facility]-EMERGENCY DEPT Provider Note: Lowella Dell, MD, FACEP  CSN: [id number] MRN: [id number] ARRIVAL: 05/17/20 at 0145 ROOM: [facility]   CHIEF COMPLAINT  Chest Pain   HISTORY OF PRESENT ILLNESS  05/17/20 4:11 AM Todd Atkinson is a 55 y.o. male with a history of hypertension for which he only takes aspirin.  He is here because he has been feeling "bad" since yesterday.  He states his blood pressure was higher than usual, over 200 systolic.  Specifically he has had a headache and neck pain which he rates as a 7 out of 10.  The pain is located primarily in his occipital and posterior neck regions.  The pain is worse with movement of the neck and he can feel his neck "crack".  He did not injure it.  He has also had a pain in his left chest which is a well localized, aching pain above the left nipple.  He rates it as a 6 out of 10.  It is not worse with movement or deep breathing but is improved when he takes aspirin.  He has had no shortness of breath.  He does get nauseated when his headache is severe.   Past Medical History:  Diagnosis Date  . Hypertension     No past surgical history on file.  No family history on file.  Social History   Tobacco Use  . Smoking status: Never Smoker  . Smokeless tobacco: Never Used  Substance Use Topics  . Alcohol use: Yes  . Drug use: No    Prior to Admission medications   Medication Sig Start Date End Date Taking? Authorizing Provider  hydrochlorothiazide (HYDRODIURIL) 25 MG tablet Take 1 tablet (25 mg total) by mouth daily. 05/17/20  Yes Marielis Samara, MD    Allergies Patient has no known allergies.   REVIEW OF SYSTEMS  Negative except as noted here or in the History of Present Illness.   PHYSICAL EXAMINATION  Initial Vital Signs Blood pressure (!) 187/86, pulse 81, temperature 98.4 F (36.9 C), temperature source Oral, resp. rate 12, height 5\' 6"  (1.676 m), weight 113.4 kg, SpO2 99 %.  Examination General:  Well-developed, well-nourished male in no acute distress; appearance consistent with age of record HENT: normocephalic; atraumatic Eyes: pupils equal, round and reactive to light; extraocular muscles intact Neck: supple; movement reproduces pain Heart: regular rate and rhythm Lungs: clear to auscultation bilaterally Abdomen: soft; nondistended; nontender; bowel sounds present Extremities: No deformity; full range of motion; pulses normal Neurologic: Awake, alert and oriented; motor function intact in all extremities and symmetric; no facial droop Skin: Warm and dry Psychiatric: Normal mood and affect   RESULTS  Summary of this visit's results, reviewed and interpreted by myself:   EKG Interpretation  Date/Time:  Thursday May 17 2020 01:55:48 EST Ventricular Rate:  74 PR Interval:    QRS Duration: 98 QT Interval:  425 QTC Calculation: 472 R Axis:   122 Text Interpretation: Sinus rhythm Left posterior fascicular block Borderline T abnormalities, anterior leads No significant change was found Confirmed by [id number] (Paula Libra) on 05/17/2020 4:25:55 AM      Laboratory Studies: Results for orders placed or performed during the hospital encounter of 05/17/20 (from the past 24 hour(s))  Basic metabolic panel     Status: Abnormal   Collection Time: 05/17/20  2:13 AM  Result Value Ref Range   Sodium 137 135 - 145 mmol/L   Potassium 3.2 (L) 3.5 - 5.1 mmol/L   Chloride 102 98 -  111 mmol/L   CO2 24 22 - 32 mmol/L   Glucose, Bld 181 (H) 70 - 99 mg/dL   BUN 13 6 - 20 mg/dL   Creatinine, Ser 4.03 0.61 - 1.24 mg/dL   Calcium 8.6 (L) 8.9 - 10.3 mg/dL   GFR, Estimated >47 >42 mL/min   Anion gap 11 5 - 15  CBC     Status: None   Collection Time: 05/17/20  2:13 AM  Result Value Ref Range   WBC 7.4 4.0 - 10.5 K/uL   RBC 4.78 4.22 - 5.81 MIL/uL   Hemoglobin 14.5 13.0 - 17.0 g/dL   HCT 59.5 63.8 - 75.6 %   MCV 88.9 80.0 - 100.0 fL   MCH 30.3 26.0 - 34.0 pg   MCHC 34.1 30.0 - 36.0  g/dL   RDW 43.3 29.5 - 18.8 %   Platelets 249 150 - 400 K/uL   nRBC 0.0 0.0 - 0.2 %  Troponin I (High Sensitivity)     Status: None   Collection Time: 05/17/20  2:13 AM  Result Value Ref Range   Troponin I (High Sensitivity) 3 <18 ng/L  Troponin I (High Sensitivity)     Status: None   Collection Time: 05/17/20  4:13 AM  Result Value Ref Range   Troponin I (High Sensitivity) 4 <18 ng/L   Imaging Studies: DG Chest 2 View  Result Date: 05/17/2020 CLINICAL DATA:  Chest pain EXAM: CHEST - 2 VIEW COMPARISON:  12/01/2017 FINDINGS: Shallow lung inflation. The heart size and mediastinal contours are within normal limits. Both lungs are clear. The visualized skeletal structures are unremarkable. IMPRESSION: No active cardiopulmonary disease. Electronically Signed   By: Deatra Robinson M.D.   On: 05/17/2020 02:52    ED COURSE and MDM  Nursing notes, initial and subsequent vitals signs, including pulse oximetry, reviewed and interpreted by myself.  Vitals:   05/17/20 0214 05/17/20 0442 05/17/20 0500 05/17/20 0530  BP: (!) 187/86 (!) 158/82 (!) 147/76 126/70  Pulse:  66 65 (!) 58  Resp:  16 15 15   Temp:      TempSrc:      SpO2:  99% 98% 97%  Weight:      Height:       Medications  potassium chloride SA (KLOR-CON) CR tablet 40 mEq (40 mEq Oral Given 05/17/20 0435)  diphenhydrAMINE (BENADRYL) injection 25 mg (25 mg Intravenous Given 05/17/20 0435)  metoCLOPramide (REGLAN) injection 10 mg (10 mg Intravenous Given 05/17/20 0434)  ketorolac (TORADOL) 15 MG/ML injection 15 mg (15 mg Intravenous Given 05/17/20 0435)  hydrochlorothiazide (MICROZIDE) capsule 25 mg (25 mg Oral Given 05/17/20 0435)   5:33 AM Blood pressure improved (126/70) after oral hydrochlorothiazide.  Will start on daily hydrochlorothiazide.  Headache resolved after IV medications.   PROCEDURES  Procedures   ED DIAGNOSES     ICD-10-CM   1. Atypical chest pain  R07.89   2. Hypertensive urgency  I16.0   3. Bad headache   R51.9   4. Hypokalemia  E87.6        [name], 07/15/20, MD 05/17/20 [phone]

## 2020-11-10 ENCOUNTER — Other Ambulatory Visit: Payer: Self-pay

## 2020-11-10 ENCOUNTER — Emergency Department (HOSPITAL_COMMUNITY)
Admission: EM | Admit: 2020-11-10 | Discharge: 2020-11-10 | Disposition: A | Discharge status: Home / Self Care | Payer: Self-pay | Attending: Emergency Medicine | Admitting: Emergency Medicine

## 2020-11-10 ENCOUNTER — Encounter (HOSPITAL_COMMUNITY): Payer: Self-pay | Admitting: Emergency Medicine

## 2020-11-10 DIAGNOSIS — G4700 Insomnia, unspecified: Secondary | ICD-10-CM | POA: Insufficient documentation

## 2020-11-10 DIAGNOSIS — T675XXA Heat exhaustion, unspecified, initial encounter: Secondary | ICD-10-CM | POA: Insufficient documentation

## 2020-11-10 DIAGNOSIS — I10 Essential (primary) hypertension: Secondary | ICD-10-CM | POA: Insufficient documentation

## 2020-11-10 DIAGNOSIS — R519 Headache, unspecified: Secondary | ICD-10-CM

## 2020-11-10 LAB — CBC
HCT: 41.7 % (ref 39.0–52.0)
Hemoglobin: 14.1 g/dL (ref 13.0–17.0)
MCH: 30.1 pg (ref 26.0–34.0)
MCHC: 33.8 g/dL (ref 30.0–36.0)
MCV: 88.9 fL (ref 80.0–100.0)
Platelets: 215 10*3/uL (ref 150–400)
RBC: 4.69 MIL/uL (ref 4.22–5.81)
RDW: 13 % (ref 11.5–15.5)
WBC: 6.1 10*3/uL (ref 4.0–10.5)
nRBC: 0 % (ref 0.0–0.2)

## 2020-11-10 LAB — BASIC METABOLIC PANEL
Anion gap: 8 (ref 5–15)
BUN: 20 mg/dL (ref 6–20)
CO2: 21 mmol/L — ABNORMAL LOW (ref 22–32)
Calcium: 8.6 mg/dL — ABNORMAL LOW (ref 8.9–10.3)
Chloride: 105 mmol/L (ref 98–111)
Creatinine, Ser: 0.78 mg/dL (ref 0.61–1.24)
GFR, Estimated: >60 mL/min (ref >60)
Glucose, Bld: 112 mg/dL — ABNORMAL HIGH (ref 70–99)
Potassium: 4.2 mmol/L (ref 3.5–5.1)
Sodium: 134 mmol/L — ABNORMAL LOW (ref 135–145)

## 2020-11-10 MED ORDER — SODIUM CHLORIDE 0.9 % IV SOLN: INTRAVENOUS | Status: DC

## 2020-11-10 MED ORDER — SODIUM CHLORIDE 0.9 % IV BOLUS
1000.0000 mL | Freq: Once | INTRAVENOUS | Status: AC
  Administered 2020-11-10: 1000 mL via INTRAVENOUS

## 2020-11-10 MED ORDER — KETOROLAC TROMETHAMINE 15 MG/ML IJ SOLN
15.0000 mg | Freq: Once | INTRAMUSCULAR | Status: AC
  Administered 2020-11-10: 15 mg via INTRAVENOUS
  Filled 2020-11-10: qty 1

## 2020-11-10 MED ORDER — AMLODIPINE BESYLATE 5 MG PO TABS: 5.0000 mg | ORAL_TABLET | Freq: Every day | ORAL | 0 refills | Status: AC

## 2020-11-10 NOTE — ED Provider Notes (Signed)
[facility]-EMERGENCY DEPT Provider Note   CSN: [id number] Arrival date & time: 11/10/20  0516     History Chief Complaint  Patient presents with   Hypertension    Todd Atkinson is a 55 y.o. male.  Pt presents to the ED today with a headache.  He said it has been bad for about 3 days.  He said his bp is also high.  He does take Lisinopril 10 mg, but does not think that helps his bp.  Pt said he feels nauseous and dizzy.  Pain has been so bad that he has not been able to sleep.  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     Past Medical History:  Diagnosis Date   Hypertension     There are no problems to display for this patient.   History reviewed. No pertinent surgical history.     History reviewed. No pertinent family history.  Social History   Tobacco Use   Smoking status: Never   Smokeless tobacco: Never  Substance Use Topics   Alcohol use: Yes   Drug use: No    Home Medications Prior to Admission medications   Medication Sig Start Date End Date Taking? Authorizing Provider  amLODipine (NORVASC) 5 MG tablet Take 1 tablet (5 mg total) by mouth daily. 11/10/20  Yes Jacalyn Lefevre, MD    Allergies    Patient has no known allergies.  Review of Systems   Review of Systems  Neurological:  Positive for headaches.  All other systems reviewed and are negative.  Physical Exam Updated Vital Signs BP (!) 158/84 (BP Location: Left Arm)   Pulse 64   Temp 98.4 F (36.9 C) (Oral)   Resp 16   Ht 5\' 6"  (1.676 m)   Wt 113.4 kg   SpO2 99%   BMI 40.35 kg/m   Physical Exam Vitals and nursing note reviewed.  Constitutional:      Appearance: Normal appearance.  HENT:     Head: Normocephalic and atraumatic.     Right Ear: External ear normal.     Left Ear: External ear normal.     Nose: Nose normal.     Mouth/Throat:     Mouth: Mucous membranes are moist.     Pharynx: Oropharynx is clear.  Eyes:     Extraocular  Movements: Extraocular movements intact.     Conjunctiva/sclera: Conjunctivae normal.     Pupils: Pupils are equal, round, and reactive to light.  Cardiovascular:     Rate and Rhythm: Normal rate and regular rhythm.     Pulses: Normal pulses.     Heart sounds: Normal heart sounds.  Pulmonary:     Effort: Pulmonary effort is normal.     Breath sounds: Normal breath sounds.  Abdominal:     General: Abdomen is flat. Bowel sounds are normal.     Palpations: Abdomen is soft.  Musculoskeletal:        General: Normal range of motion.     Cervical back: Normal range of motion and neck supple.  Skin:    General: Skin is warm.     Capillary Refill: Capillary refill takes less than 2 seconds.  Neurological:     General: No focal deficit present.     Mental Status: He is alert and oriented to person, place, and time.  Psychiatric:        Mood and Affect: Mood normal.  Behavior: Behavior normal.        Thought Content: Thought content normal.   ED Results / Procedures / Treatments   Labs (all labs ordered are listed, but only abnormal results are displayed) Labs Reviewed  BASIC METABOLIC PANEL - Abnormal; Notable for the following components:      Result Value   Sodium 134 (*)    CO2 21 (*)    Glucose, Bld 112 (*)    Calcium 8.6 (*)    All other components within normal limits  CBC    EKG None  Radiology No results found.  Procedures Procedures   Medications Ordered in ED Medications  sodium chloride 0.9 % bolus 1,000 mL (1,000 mLs Intravenous New Bag/Given 11/10/20 7829)    And  0.9 %  sodium chloride infusion (has no administration in time range)  ketorolac (TORADOL) 15 MG/ML injection 15 mg (15 mg Intravenous Given 11/10/20 0617)    ED Course  I have reviewed the triage vital signs and the nursing notes.  Pertinent labs & imaging results that were available during my care of the patient were reviewed by me and considered in my medical decision making (see chart  for details).    MDM Rules/Calculators/A&P                           Pt does not have a pcp or insurance.  He needs to have his htn managed by a pcp, so I consulted SW.  I am going to have him stop the lisinopril and start norvasc.  Pt is stable for d/c.  Return if worse.  Final Clinical Impression(s) / ED Diagnoses Final diagnoses:  Hypertension, unspecified type  Acute nonintractable headache, unspecified headache type  Heat exhaustion, initial encounter  Insomnia, unspecified type    Rx / DC Orders ED Discharge Orders          Ordered    amLODipine (NORVASC) 5 MG tablet  Daily        11/10/20 0612             Jacalyn Lefevre, MD 11/10/20 [phone]

## 2020-11-10 NOTE — ED Notes (Signed)
Discharged paper work explain to the patient using interpreter [name]) Todd Atkinson [phone]. Patient verbalized understanding.

## 2020-11-10 NOTE — ED Triage Notes (Addendum)
Interpreter Katherin [phone]  Pt reports having a bad headache, eye pain, nausea, dizziness with position change, weakness and high blood pressure. States that symptoms started 3 days ago, but he has been "dealing with it" for about 6 months intermittently. Reports that he is on lisinopril 10 mg.

## 2020-11-10 NOTE — Discharge Instructions (Addendum)
Suspenda Lisinopril y comience Norvasc. Tambin suspenda la hidroclorotiazida.

## 2020-11-12 ENCOUNTER — Emergency Department (HOSPITAL_COMMUNITY): Payer: Self-pay

## 2020-11-12 ENCOUNTER — Encounter (HOSPITAL_COMMUNITY): Payer: Self-pay | Admitting: *Deleted

## 2020-11-12 ENCOUNTER — Emergency Department (HOSPITAL_COMMUNITY)
Admission: EM | Admit: 2020-11-12 | Discharge: 2020-11-12 | Disposition: A | Discharge status: Home / Self Care | Payer: Self-pay | Attending: Emergency Medicine | Admitting: Emergency Medicine

## 2020-11-12 DIAGNOSIS — I10 Essential (primary) hypertension: Secondary | ICD-10-CM | POA: Insufficient documentation

## 2020-11-12 DIAGNOSIS — Z79899 Other long term (current) drug therapy: Secondary | ICD-10-CM | POA: Insufficient documentation

## 2020-11-12 DIAGNOSIS — R519 Headache, unspecified: Secondary | ICD-10-CM | POA: Insufficient documentation

## 2020-11-12 LAB — CBC
HCT: 40.7 % (ref 39.0–52.0)
Hemoglobin: 13.8 g/dL (ref 13.0–17.0)
MCH: 30 pg (ref 26.0–34.0)
MCHC: 33.9 g/dL (ref 30.0–36.0)
MCV: 88.5 fL (ref 80.0–100.0)
Platelets: 241 10*3/uL (ref 150–400)
RBC: 4.6 MIL/uL (ref 4.22–5.81)
RDW: 12.7 % (ref 11.5–15.5)
WBC: 6.4 10*3/uL (ref 4.0–10.5)
nRBC: 0 % (ref 0.0–0.2)

## 2020-11-12 LAB — BASIC METABOLIC PANEL
Anion gap: 9 (ref 5–15)
BUN: 12 mg/dL (ref 6–20)
CO2: 24 mmol/L (ref 22–32)
Calcium: 9 mg/dL (ref 8.9–10.3)
Chloride: 104 mmol/L (ref 98–111)
Creatinine, Ser: 0.64 mg/dL (ref 0.61–1.24)
GFR, Estimated: >60 mL/min (ref >60)
Glucose, Bld: 109 mg/dL — ABNORMAL HIGH (ref 70–99)
Potassium: 3.8 mmol/L (ref 3.5–5.1)
Sodium: 137 mmol/L (ref 135–145)

## 2020-11-12 LAB — TROPONIN I (HIGH SENSITIVITY)
Troponin I (High Sensitivity): 2 ng/L (ref <18)
Troponin I (High Sensitivity): 3 ng/L (ref <18)

## 2020-11-12 MED ORDER — DIPHENHYDRAMINE HCL 50 MG/ML IJ SOLN
12.5000 mg | Freq: Once | INTRAMUSCULAR | Status: AC
  Administered 2020-11-12: 12.5 mg via INTRAVENOUS
  Filled 2020-11-12: qty 1

## 2020-11-12 MED ORDER — METOCLOPRAMIDE HCL 5 MG/ML IJ SOLN
10.0000 mg | Freq: Once | INTRAMUSCULAR | Status: AC
  Administered 2020-11-12: 10 mg via INTRAVENOUS
  Filled 2020-11-12: qty 2

## 2020-11-12 MED ORDER — ACETAMINOPHEN 325 MG PO TABS
650.0000 mg | ORAL_TABLET | Freq: Once | ORAL | Status: AC
  Administered 2020-11-12: 650 mg via ORAL
  Filled 2020-11-12: qty 2

## 2020-11-12 NOTE — ED Provider Notes (Signed)
Patient care assumed from [place], [place], PA-C at shift change.  Please see her note for complete history.  Patient presents with headache and chest pain.  Was seen 2 days ago in the ED for headache and was switched from lisinopril to amlodipine but states his headache has stayed the same and slightly worsened.  He also had chest pain today, prompting him to seek ED treatment.  Work-up thus far has been reassuring, CT of head and second opponent are still pending.  He has no cardiac history and does not seem to have regular primary care visits.  He was set up with social work for PCP at his last ED visit, states she has an appointment scheduled.   Physical Exam  BP (!) 154/101   Pulse 66   Temp 98.1 F (36.7 C) (Oral)   Resp 16   SpO2 100%   Physical Exam Vitals and nursing note reviewed. Exam conducted with a chaperone present.  Constitutional:      General: He is not in acute distress.    Appearance: Normal appearance. He is obese.  HENT:     Head: Normocephalic and atraumatic.  Eyes:     General: No scleral icterus.    Extraocular Movements: Extraocular movements intact.     Pupils: Pupils are equal, round, and reactive to light.  Cardiovascular:     Rate and Rhythm: Normal rate and regular rhythm.  Skin:    Coloration: Skin is not jaundiced.  Neurological:     General: No focal deficit present.     Mental Status: He is alert and oriented to person, place, and time. Mental status is at baseline.     Cranial Nerves: No cranial nerve deficit.     Coordination: Coordination normal.    ED Course/Procedures   Clinical Course as of 11/12/20 1919  Mon Nov 12, 2020  1448 Troponin I (High Sensitivity): 2 [HK]  1448 WBC: 6.4 [HK]    Clinical Course User Index [HK] Khatri, Hina, PA-C    Procedures  MDM  CT head reassuring, no signs of hemorrhage or emergent pathology.  Troponins negative, EKG without signs of ST elevation or ACS.  Labs are also reassuring, no signs of  hypertensive urgency.  Overall work-up is reassuring, his headache has improved.  He is appropriate for discharge at this time with follow-up with PCP.       Theron Arista, PA-C 11/12/20 1920    Margarita Grizzle, MD 11/12/20 [phone]

## 2020-11-12 NOTE — ED Notes (Signed)
Patient has a blue top in the main lab °

## 2020-11-12 NOTE — ED Provider Notes (Signed)
[facility]-EMERGENCY DEPT Provider Note   CSN: [id number] Arrival date & time: 11/12/20  1213     History Chief Complaint  Patient presents with   Chest Pain    Todd Atkinson is a 55 y.o. male with a past medical history of hypertension presenting to the ED with a chief complaint of chest pain and continued headache.  States that he has had a headache for the past 2 days.  Was evaluated here 2 days ago when symptoms began and was switched to a different blood pressure medication.  Has been compliant with this but continues to be symptomatic.  Started having chest tightness today.  Reports associated hypertension as well.  He denies any vision changes, vomiting, shortness of breath.  Denies any injuries or falls or anticoagulant use.  No history of MI, DVT or PE, recent immobilization  HPI History provided using medical Spanish interpreter at the bedside.    Past Medical History:  Diagnosis Date   Hypertension     There are no problems to display for this patient.   History reviewed. No pertinent surgical history.     No family history on file.  Social History   Tobacco Use   Smoking status: Never   Smokeless tobacco: Never  Substance Use Topics   Alcohol use: Yes   Drug use: No    Home Medications Prior to Admission medications   Medication Sig Start Date End Date Taking? Authorizing Provider  amLODipine (NORVASC) 5 MG tablet Take 1 tablet (5 mg total) by mouth daily. 11/10/20   Jacalyn Lefevre, MD    Allergies    Patient has no known allergies.  Review of Systems   Review of Systems  Constitutional:  Negative for appetite change, chills and fever.  HENT:  Negative for ear pain, rhinorrhea, sneezing and sore throat.   Eyes:  Negative for photophobia and visual disturbance.  Respiratory:  Negative for cough, chest tightness, shortness of breath and wheezing.   Cardiovascular:  Positive for chest pain. Negative for palpitations.   Gastrointestinal:  Negative for abdominal pain, blood in stool, constipation, diarrhea, nausea and vomiting.  Genitourinary:  Negative for dysuria, hematuria and urgency.  Musculoskeletal:  Negative for myalgias.  Skin:  Negative for rash.  Neurological:  Positive for headaches. Negative for dizziness, weakness and light-headedness.   Physical Exam Updated Vital Signs BP (!) 154/101   Pulse 66   Temp 98.1 F (36.7 C) (Oral)   Resp 13   SpO2 100%   Physical Exam Vitals and nursing note reviewed.  Constitutional:      General: He is not in acute distress.    Appearance: He is well-developed.  HENT:     Head: Normocephalic and atraumatic.     Nose: Nose normal.  Eyes:     General: No scleral icterus.       Left eye: No discharge.     Conjunctiva/sclera: Conjunctivae normal.  Cardiovascular:     Rate and Rhythm: Normal rate and regular rhythm.     Heart sounds: Normal heart sounds. No murmur heard.   No friction rub. No gallop.  Pulmonary:     Effort: Pulmonary effort is normal. No respiratory distress.     Breath sounds: Normal breath sounds.  Abdominal:     General: Bowel sounds are normal. There is no distension.     Palpations: Abdomen is soft.     Tenderness: There is no abdominal tenderness. There is no guarding.  Musculoskeletal:  General: Normal range of motion.     Cervical back: Normal range of motion and neck supple.  Skin:    General: Skin is warm and dry.     Findings: No rash.  Neurological:     Mental Status: He is alert and oriented to person, place, and time.     Cranial Nerves: No cranial nerve deficit.     Sensory: No sensory deficit.     Motor: No weakness or abnormal muscle tone.     Coordination: Coordination normal.     Comments: Pupils reactive. No facial asymmetry noted. Cranial nerves appear grossly intact. Sensation intact to light touch on face, BUE and BLE. Strength 5/5 in BUE and BLE.     ED Results / Procedures / Treatments    Labs (all labs ordered are listed, but only abnormal results are displayed) Labs Reviewed  BASIC METABOLIC PANEL - Abnormal; Notable for the following components:      Result Value   Glucose, Bld 109 (*)    All other components within normal limits  CBC  TROPONIN I (HIGH SENSITIVITY)  TROPONIN I (HIGH SENSITIVITY)    EKG EKG Interpretation  Date/Time:  Monday November 12 2020 12:39:57 EDT Ventricular Rate:  68 PR Interval:  140 QRS Duration: 95 QT Interval:  418 QTC Calculation: 445 R Axis:   21 Text Interpretation: Sinus rhythm 12 Lead; Mason-Likar normal axis No acute ischemia Confirmed by Pieter Partridge ([id number]) on 11/12/2020 12:42:42 PM  Radiology DG Chest 2 View  Result Date: 11/12/2020 CLINICAL DATA:  Chest pain, headache for 2 days, hypertension, worsening symptoms since being seen 2 days ago EXAM: CHEST - 2 VIEW COMPARISON:  05/17/2020 FINDINGS: Upper normal size of cardiac silhouette. Mediastinal contours and pulmonary vascularity normal. Lungs clear. No acute infiltrate, pleural effusion, or pneumothorax. Osseous structures unremarkable. IMPRESSION: No acute abnormalities. Electronically Signed   By: Ulyses Southward M.D.   On: 11/12/2020 13:33    Procedures Procedures   Medications Ordered in ED Medications  acetaminophen (TYLENOL) tablet 650 mg (has no administration in time range)  metoCLOPramide (REGLAN) injection 10 mg (has no administration in time range)  diphenhydrAMINE (BENADRYL) injection 12.5 mg (has no administration in time range)    ED Course  I have reviewed the triage vital signs and the nursing notes.  Pertinent labs & imaging results that were available during my care of the patient were reviewed by me and considered in my medical decision making (see chart for details).  Clinical Course as of 11/12/20 1511  Mon Nov 12, 2020  1448 Troponin I (High Sensitivity): 2 [HK]  1448 WBC: 6.4 [HK]    Clinical Course User Index [HK] Dietrich Pates, PA-C   MDM  Rules/Calculators/A&P                           55 year old male with past medical history of hypertension presenting to the ED for headache and chest pain.  He has had a headache since he was evaluated 2 days ago.  Today started experiencing chest pain.  When he was evaluated 2 days ago he was switched to amlodipine from lisinopril.  He has been compliant with this medication but states that he can still feel that his blood pressure is elevated.  He was given Toradol at his visit 2 days ago.  He took an aspirin today without much improvement in his symptoms.  Denies any shortness of breath, vision changes, numbness in arms  or legs or vomiting.  On exam patient without any neurological deficits.  He has no numbness or weakness on exam.  No facial asymmetry.  Lungs are clear to auscultation bilaterally.  EKG shows sinus rhythm, no ischemic changes, no STEMI.  Chest x-ray is unremarkable.  Due to his persistent headache and age as well as his history of hypertension will obtain CT imaging to rule out structural cause. Will treat with Tylenol and migraine cocktail.  He is pending a CT of the head and delta troponin. I did urge importance of following up with her primary care provider and general for further hypertension control.   Portions of this note were generated with Scientist, clinical (histocompatibility and immunogenetics). Dictation errors may occur despite best attempts at proofreading.  Final Clinical Impression(s) / ED Diagnoses Final diagnoses:  Hypertension, unspecified type    Rx / DC Orders ED Discharge Orders     None        Dietrich Pates, PA-C 11/12/20 1512    Koleen Distance, MD 11/12/20 1539

## 2020-11-12 NOTE — ED Triage Notes (Signed)
Pt complains of chest pain, hypertension, headache x 2 days. Was seen two days ago but reports symptoms are worse. Started on amlodipine.   Spanish interpreter used.

## 2020-11-12 NOTE — Discharge Instructions (Addendum)
Follow-up with your primary care provider or the one listed below as you will need further blood pressure control. In the meantime continue the medications that you have. Return to the ER if you start to experience worsening chest pain, vision changes, numbness in arms or legs, neck stiffness or shortness of breath.   Haga un seguimiento con su proveedor de atencin primaria o con el que se indica a continuacin, ya que Pension scheme manager un mayor control de la presin arterial. Mientras tanto contine con los medicamentos que tiene. Regrese a la sala de emergencias si comienza a experimentar un empeoramiento del dolor en el pecho, cambios en la visin, entumecimiento en brazos o piernas, rigidez en el cuello o dificultad para respirar.

## 2020-11-12 NOTE — ED Provider Notes (Signed)
Emergency Medicine Provider Triage Evaluation Note  Ari Bernabei , a 55 y.o. male  was evaluated in triage.  Pt complains of chest pain along with elevated BP at home since Saturday. Previously seen and given BP meds, which he has been taking daily. No CAD, Fhx CAD on mother side, non smoker.   Review of Systems  Positive: Chest pain, headache Negative: Fever, shortness of breath, leg swelling  Physical Exam  BP (!) 155/91 (BP Location: Left Arm)   Pulse 75   Temp 98.1 F (36.7 C) (Oral)   Resp 18   SpO2 100%  Gen:   Awake, no distress   Resp:  Normal effort  MSK:   Moves extremities without difficulty  Other:    Medical Decision Making  Medically screening exam initiated at 12:41 PM.  Appropriate orders placed.  Kohner Sha Amer was informed that the remainder of the evaluation will be completed by another provider, this initial triage assessment does not replace that evaluation, and the importance of remaining in the ED until their evaluation is complete.  Patient here with recurrent elevated BP and headache. Prescribed norvasc which he has been taking daily., also took ASA prior to arrival for headache without improvement.    Claude Manges, PA-C 11/12/20 1243    Sloan Leiter, DO 11/12/20 1744

## 2023-09-02 ENCOUNTER — Ambulatory Visit: Payer: Self-pay | Admitting: Internal Medicine
